# Patient Record
Sex: Male | Born: 1940 | Race: White | Hispanic: No | Marital: Married | Smoking: Never smoker
Health system: Southern US, Community
[De-identification: ages and names within clinical notes are randomized; demographics above are authoritative.]

## PROBLEM LIST (undated history)

## (undated) DIAGNOSIS — M48062 Spinal stenosis, lumbar region with neurogenic claudication: Secondary | ICD-10-CM

## (undated) DIAGNOSIS — R51 Headache: Secondary | ICD-10-CM

## (undated) DIAGNOSIS — T783XXA Angioneurotic edema, initial encounter: Secondary | ICD-10-CM

## (undated) DIAGNOSIS — R519 Headache, unspecified: Secondary | ICD-10-CM

## (undated) DIAGNOSIS — R972 Elevated prostate specific antigen [PSA]: Secondary | ICD-10-CM

## (undated) DIAGNOSIS — M1990 Unspecified osteoarthritis, unspecified site: Secondary | ICD-10-CM

## (undated) DIAGNOSIS — I10 Essential (primary) hypertension: Secondary | ICD-10-CM

## (undated) HISTORY — DX: Angioneurotic edema, initial encounter: T78.3XXA

## (undated) HISTORY — PX: COLONOSCOPY: SHX174

## (undated) HISTORY — PX: HEMORROIDECTOMY: SUR656

---

## 1996-03-14 HISTORY — PX: OTHER SURGICAL HISTORY: SHX169

## 2016-10-25 ENCOUNTER — Other Ambulatory Visit: Payer: Self-pay | Admitting: Neurosurgery

## 2016-10-25 DIAGNOSIS — M48062 Spinal stenosis, lumbar region with neurogenic claudication: Secondary | ICD-10-CM

## 2016-10-25 DIAGNOSIS — G9519 Other vascular myelopathies: Secondary | ICD-10-CM

## 2016-11-18 ENCOUNTER — Ambulatory Visit
Admission: RE | Admit: 2016-11-18 | Discharge: 2016-11-18 | Disposition: A | Discharge status: Home / Self Care | Payer: Medicare Other | Source: Ambulatory Visit | Attending: Neurosurgery | Admitting: Neurosurgery

## 2016-11-18 DIAGNOSIS — M48062 Spinal stenosis, lumbar region with neurogenic claudication: Secondary | ICD-10-CM

## 2016-11-18 DIAGNOSIS — G9519 Other vascular myelopathies: Secondary | ICD-10-CM

## 2016-11-23 ENCOUNTER — Other Ambulatory Visit: Payer: Self-pay | Admitting: Neurosurgery

## 2016-12-29 NOTE — Pre-Procedure Instructions (Signed)
Lauree ChandlerRickie Ferrante  12/29/2016      [facility] - [place], [place] - [id number] [place] [id number] Phone: [phone] Fax: [phone]    Your procedure is scheduled on Oct 22.  [facility] Admitting at 530 A.M.  Call this number if you have problems the morning of surgery:  [phone]   Remember:  Do not eat food or drink liquids after midnight.  Take these medicines the morning of surgery with A SIP OF WATER Tamsulosin (Flomax)  Stop taking aspirin, BC's, Goody's, Herbal medications, Ibuprofen, Advil, Motrin, Aleve   Do not wear jewelry, make-up or nail polish.  Do not wear lotions, powders, or perfumes, or deoderant.  Do not shave 48 hours prior to surgery.  Men may shave face and neck.  Do not bring valuables to the hospital.  [facility] is not responsible for any belongings or valuables.  Contacts, dentures or bridgework may not be worn into surgery.  Leave your suitcase in the car.  After surgery it may be brought to your room.  For patients admitted to the hospital, discharge time will be determined by your treatment team.  Patients discharged the day of surgery will not be allowed to drive home.    Special instructions:  [place] - Preparing for Surgery  Before surgery, you can play an important role.  Because skin is not sterile, your skin needs to be as free of germs as possible.  You can reduce the number of germs on you skin by washing with CHG (chlorahexidine gluconate) soap before surgery.  CHG is an antiseptic cleaner which kills germs and bonds with the skin to continue killing germs even after washing.  Please DO NOT use if you have an allergy to CHG or antibacterial soaps.  If your skin becomes reddened/irritated stop using the CHG and inform your nurse when you arrive at Short Stay.  Do not shave (including legs and underarms) for at least 48 hours prior to the first CHG  shower.  You may shave your face.  Please follow these instructions carefully:   1.  Shower with CHG Soap the night before surgery and the                                morning of Surgery.  2.  If you choose to wash your hair, wash your hair first as usual with your       normal shampoo.  3.  After you shampoo, rinse your hair and body thoroughly to remove the                      Shampoo.  4.  Use CHG as you would any other liquid soap.  You can apply chg directly       to the skin and wash gently with scrungie or a clean washcloth.  5.  Apply the CHG Soap to your body ONLY FROM THE NECK DOWN.        Do not use on open wounds or open sores.  Avoid contact with your eyes,       ears, mouth and genitals (private parts).  Wash genitals (private parts)       with your normal soap.  6.  Wash thoroughly, paying special attention to the area where your surgery  will be performed.  7.  Thoroughly rinse your body with warm water from the neck down.  8.  DO NOT shower/wash with your normal soap after using and rinsing off       the CHG Soap.  9.  Pat yourself dry with a clean towel.            10.  Wear clean pajamas.            11.  Place clean sheets on your bed the night of your first shower and do not        sleep with pets.  Day of Surgery  Do not apply any lotions/deoderants the morning of surgery.  Please wear clean clothes to the hospital/surgery center.     Please read over the following fact sheets that you were given. Pain Booklet, Coughing and Deep Breathing, MRSA Information and Surgical Site Infection Prevention

## 2016-12-30 ENCOUNTER — Encounter (HOSPITAL_COMMUNITY)
Admission: RE | Admit: 2016-12-30 | Discharge: 2016-12-30 | Disposition: A | Discharge status: Home / Self Care | Payer: Medicare Other | Source: Ambulatory Visit | Attending: Neurosurgery | Admitting: Neurosurgery

## 2016-12-30 ENCOUNTER — Other Ambulatory Visit: Payer: Self-pay

## 2016-12-30 ENCOUNTER — Encounter (HOSPITAL_COMMUNITY): Payer: Self-pay

## 2016-12-30 HISTORY — DX: Essential (primary) hypertension: I10

## 2016-12-30 HISTORY — DX: Headache, unspecified: R51.9

## 2016-12-30 HISTORY — DX: Elevated prostate specific antigen (PSA): R97.20

## 2016-12-30 HISTORY — DX: Headache: R51

## 2016-12-30 HISTORY — DX: Unspecified osteoarthritis, unspecified site: M19.90

## 2016-12-30 LAB — CBC
HCT: 43.2 % (ref 39.0–52.0)
HEMOGLOBIN: 14.5 g/dL (ref 13.0–17.0)
MCH: 29.8 pg (ref 26.0–34.0)
MCHC: 33.6 g/dL (ref 30.0–36.0)
MCV: 88.9 fL (ref 78.0–100.0)
Platelets: 222 10*3/uL (ref 150–400)
RBC: 4.86 MIL/uL (ref 4.22–5.81)
RDW: 12.7 % (ref 11.5–15.5)
WBC: 8.2 10*3/uL (ref 4.0–10.5)

## 2016-12-30 LAB — BASIC METABOLIC PANEL
ANION GAP: 8 (ref 5–15)
BUN: 20 mg/dL (ref 6–20)
CALCIUM: 9.4 mg/dL (ref 8.9–10.3)
CHLORIDE: 107 mmol/L (ref 101–111)
CO2: 24 mmol/L (ref 22–32)
Creatinine, Ser: 1.07 mg/dL (ref 0.61–1.24)
GFR calc non Af Amer: >60 mL/min (ref >60)
Glucose, Bld: 94 mg/dL (ref 65–99)
Potassium: 3.7 mmol/L (ref 3.5–5.1)
Sodium: 139 mmol/L (ref 135–145)

## 2016-12-30 LAB — TYPE AND SCREEN
ABO/RH(D): O POS
Antibody Screen: NEGATIVE

## 2016-12-30 LAB — SURGICAL PCR SCREEN
MRSA, PCR: NEGATIVE
Staphylococcus aureus: NEGATIVE

## 2016-12-30 LAB — ABO/RH: ABO/RH(D): O POS

## 2016-12-30 NOTE — Progress Notes (Signed)
EKG  to Chauncey MannAngela Kabbee, FNP to review.

## 2016-12-30 NOTE — Progress Notes (Addendum)
PC[place] [facility] [place] [facility] [place] Denies ever seeing a cardiologist.  Denies any chest pain, fever, or cough. Denies ever having a stress test, echo, or card cath. Denies having a recent EKG or CXR.

## 2016-12-30 NOTE — Progress Notes (Signed)
Anesthesia Chart Review:  Pt is a 76 year old male scheduled for L2-3 decompression, PLIF, and posterior lateral arthrodesis on 01/02/2017 with Shirlean Kellyobert Nudelman, MD  PMH includes:  HTN. Never smoker. BMI 29  Medications include: benazepril, pravastatin, dyazide.   BP (!) 150/96   Pulse 94   Temp 36.4 C   Resp 20   Ht 5\' 10"  (1.778 m)   Wt 200 lb 11.2 oz (91 kg)   SpO2 96%   BMI 28.80 kg/m   Preoperative labs reviewed.    EKG 12/30/16: NSR. LAFB.   If no changes, I anticipate pt can proceed with surgery as scheduled.    Rica Mastngela Tascha Casares, FNP-BC [facility]esthesiology Phone: [phone] 12/30/2016 4:53 PM

## 2017-01-01 NOTE — Anesthesia Preprocedure Evaluation (Addendum)
Anesthesia Evaluation  Patient identified by MRN, date of birth, ID band Patient awake    Reviewed: Allergy & Precautions, NPO status , Patient's Chart, lab work & pertinent test results  History of Anesthesia Complications Negative for: history of anesthetic complications  Airway Mallampati: II  TM Distance: >3 FB Neck ROM: Full    Dental no notable dental hx. (+) Dental Advisory Given   Pulmonary neg pulmonary ROS,    Pulmonary exam normal        Cardiovascular hypertension, Pt. on medications Normal cardiovascular exam     Neuro/Psych  Headaches,    GI/Hepatic negative GI ROS, Neg liver ROS,   Endo/Other  negative endocrine ROS  Renal/GU negative Renal ROS     Musculoskeletal negative musculoskeletal ROS (+)   Abdominal   Peds  Hematology negative hematology ROS (+)   Anesthesia Other Findings Day of surgery medications reviewed with the patient.  Reproductive/Obstetrics                            Anesthesia Physical Anesthesia Plan  ASA: II  Anesthesia Plan: General   Post-op Pain Management:    Induction: Intravenous  PONV Risk Score and Plan: 3 and Ondansetron, Dexamethasone and Scopolamine patch - Pre-op  Airway Management Planned: Oral ETT  Additional Equipment:   Intra-op Plan:   Post-operative Plan: Extubation in OR  Informed Consent: I have reviewed the patients History and Physical, chart, labs and discussed the procedure including the risks, benefits and alternatives for the proposed anesthesia with the patient or authorized representative who has indicated his/her understanding and acceptance.   Dental advisory given  Plan Discussed with: CRNA, Anesthesiologist and Surgeon  Anesthesia Plan Comments:        Anesthesia Quick Evaluation

## 2017-01-02 ENCOUNTER — Inpatient Hospital Stay (HOSPITAL_COMMUNITY)
Admission: RE | Disposition: A | Discharge status: Home / Self Care | Payer: Self-pay | Source: Ambulatory Visit | Attending: Neurosurgery

## 2017-01-02 ENCOUNTER — Inpatient Hospital Stay (HOSPITAL_COMMUNITY): Payer: Medicare Other | Admitting: Emergency Medicine

## 2017-01-02 ENCOUNTER — Encounter (HOSPITAL_COMMUNITY): Payer: Self-pay

## 2017-01-02 ENCOUNTER — Inpatient Hospital Stay (HOSPITAL_COMMUNITY): Payer: Medicare Other

## 2017-01-02 ENCOUNTER — Inpatient Hospital Stay (HOSPITAL_COMMUNITY): Payer: Medicare Other | Admitting: Anesthesiology

## 2017-01-02 ENCOUNTER — Inpatient Hospital Stay (HOSPITAL_COMMUNITY)
Admission: RE | Admit: 2017-01-02 | Discharge: 2017-01-03 | DRG: 455 | Disposition: A | Discharge status: Home / Self Care | Payer: Medicare Other | Source: Ambulatory Visit | Attending: Neurosurgery | Admitting: Neurosurgery

## 2017-01-02 DIAGNOSIS — I10 Essential (primary) hypertension: Secondary | ICD-10-CM | POA: Diagnosis present

## 2017-01-02 DIAGNOSIS — M5136 Other intervertebral disc degeneration, lumbar region: Secondary | ICD-10-CM | POA: Diagnosis present

## 2017-01-02 DIAGNOSIS — M5126 Other intervertebral disc displacement, lumbar region: Secondary | ICD-10-CM | POA: Diagnosis present

## 2017-01-02 DIAGNOSIS — Z79899 Other long term (current) drug therapy: Secondary | ICD-10-CM | POA: Diagnosis not present

## 2017-01-02 DIAGNOSIS — M545 Low back pain: Secondary | ICD-10-CM | POA: Diagnosis present

## 2017-01-02 DIAGNOSIS — M48062 Spinal stenosis, lumbar region with neurogenic claudication: Principal | ICD-10-CM | POA: Diagnosis present

## 2017-01-02 DIAGNOSIS — M479 Spondylosis, unspecified: Secondary | ICD-10-CM | POA: Diagnosis present

## 2017-01-02 DIAGNOSIS — Z419 Encounter for procedure for purposes other than remedying health state, unspecified: Secondary | ICD-10-CM

## 2017-01-02 HISTORY — DX: Spinal stenosis, lumbar region with neurogenic claudication: M48.062

## 2017-01-02 SURGERY — POSTERIOR LUMBAR FUSION 1 LEVEL
Anesthesia: General | Site: Back

## 2017-01-02 MED ORDER — SODIUM CHLORIDE 0.9% FLUSH
3.0000 mL | Freq: Two times a day (BID) | INTRAVENOUS | Status: DC
  Administered 2017-01-02: 3 mL via INTRAVENOUS

## 2017-01-02 MED ORDER — PRAVASTATIN SODIUM 10 MG PO TABS
10.0000 mg | ORAL_TABLET | Freq: Every day | ORAL | Status: DC
  Administered 2017-01-02: 10 mg via ORAL
  Filled 2017-01-02: qty 1

## 2017-01-02 MED ORDER — SODIUM CHLORIDE 0.9 % IV SOLN: 250.0000 mL | INTRAVENOUS | Status: DC

## 2017-01-02 MED ORDER — TRIAMTERENE-HCTZ 37.5-25 MG PO TABS
1.0000 | ORAL_TABLET | Freq: Every day | ORAL | Status: DC
  Administered 2017-01-02: 1 via ORAL
  Filled 2017-01-02 (×2): qty 1

## 2017-01-02 MED ORDER — BUPIVACAINE HCL (PF) 0.5 % IJ SOLN
INTRAMUSCULAR | Status: DC | PRN
  Administered 2017-01-02: 5 mL

## 2017-01-02 MED ORDER — SUGAMMADEX SODIUM 200 MG/2ML IV SOLN
INTRAVENOUS | Status: DC | PRN
  Administered 2017-01-02: 181.4 mg via INTRAVENOUS

## 2017-01-02 MED ORDER — SUCCINYLCHOLINE CHLORIDE 200 MG/10ML IV SOSY
PREFILLED_SYRINGE | INTRAVENOUS | Status: AC
  Filled 2017-01-02: qty 10

## 2017-01-02 MED ORDER — PHENOL 1.4 % MT LIQD: 1.0000 | OROMUCOSAL | Status: DC | PRN

## 2017-01-02 MED ORDER — LIDOCAINE-EPINEPHRINE 1 %-1:100000 IJ SOLN
INTRAMUSCULAR | Status: AC
  Filled 2017-01-02: qty 1

## 2017-01-02 MED ORDER — HYDROXYZINE HCL 50 MG/ML IM SOLN: 50.0000 mg | INTRAMUSCULAR | Status: DC | PRN

## 2017-01-02 MED ORDER — ONDANSETRON HCL 4 MG/2ML IJ SOLN
INTRAMUSCULAR | Status: DC | PRN
  Administered 2017-01-02: 4 mg via INTRAVENOUS

## 2017-01-02 MED ORDER — VITAMIN D 1000 UNITS PO TABS: 1000.0000 [IU] | ORAL_TABLET | Freq: Every day | ORAL | Status: DC

## 2017-01-02 MED ORDER — ACETAMINOPHEN 650 MG RE SUPP: 650.0000 mg | RECTAL | Status: DC | PRN

## 2017-01-02 MED ORDER — ONDANSETRON HCL 4 MG/2ML IJ SOLN
INTRAMUSCULAR | Status: AC
  Filled 2017-01-02: qty 2

## 2017-01-02 MED ORDER — BENAZEPRIL HCL 5 MG PO TABS
5.0000 mg | ORAL_TABLET | Freq: Every day | ORAL | Status: DC
  Administered 2017-01-02: 5 mg via ORAL
  Filled 2017-01-02 (×2): qty 1

## 2017-01-02 MED ORDER — DEXAMETHASONE SODIUM PHOSPHATE 10 MG/ML IJ SOLN
INTRAMUSCULAR | Status: DC | PRN
  Administered 2017-01-02: 10 mg via INTRAVENOUS

## 2017-01-02 MED ORDER — CHLORHEXIDINE GLUCONATE CLOTH 2 % EX PADS: 6.0000 | MEDICATED_PAD | Freq: Once | CUTANEOUS | Status: DC

## 2017-01-02 MED ORDER — BISACODYL 10 MG RE SUPP: 10.0000 mg | Freq: Every day | RECTAL | Status: DC | PRN

## 2017-01-02 MED ORDER — PHENYLEPHRINE 40 MCG/ML (10ML) SYRINGE FOR IV PUSH (FOR BLOOD PRESSURE SUPPORT)
PREFILLED_SYRINGE | INTRAVENOUS | Status: AC
  Filled 2017-01-02: qty 10

## 2017-01-02 MED ORDER — EPHEDRINE SULFATE 50 MG/ML IJ SOLN
INTRAMUSCULAR | Status: DC | PRN
  Administered 2017-01-02 (×2): 10 mg via INTRAVENOUS

## 2017-01-02 MED ORDER — FLEET ENEMA 7-19 GM/118ML RE ENEM: 1.0000 | ENEMA | Freq: Once | RECTAL | Status: DC | PRN

## 2017-01-02 MED ORDER — BACITRACIN 50000 UNITS IM SOLR
INTRAMUSCULAR | Status: DC | PRN
  Administered 2017-01-02: 08:00:00

## 2017-01-02 MED ORDER — ROCURONIUM BROMIDE 100 MG/10ML IV SOLN
INTRAVENOUS | Status: DC | PRN
  Administered 2017-01-02: 30 mg via INTRAVENOUS
  Administered 2017-01-02: 50 mg via INTRAVENOUS
  Administered 2017-01-02: 20 mg via INTRAVENOUS

## 2017-01-02 MED ORDER — HYDROCODONE-ACETAMINOPHEN 5-325 MG PO TABS
1.0000 | ORAL_TABLET | ORAL | Status: DC | PRN
  Administered 2017-01-02: 2 via ORAL
  Filled 2017-01-02: qty 2

## 2017-01-02 MED ORDER — ACETAMINOPHEN 10 MG/ML IV SOLN
INTRAVENOUS | Status: DC | PRN
  Administered 2017-01-02: 1000 mg via INTRAVENOUS

## 2017-01-02 MED ORDER — MENTHOL 3 MG MT LOZG
1.0000 | LOZENGE | OROMUCOSAL | Status: DC | PRN
Start: 2017-01-02 — End: 2017-01-03

## 2017-01-02 MED ORDER — ONDANSETRON HCL 4 MG/2ML IJ SOLN: 4.0000 mg | Freq: Four times a day (QID) | INTRAMUSCULAR | Status: DC | PRN

## 2017-01-02 MED ORDER — BUPIVACAINE HCL (PF) 0.5 % IJ SOLN
INTRAMUSCULAR | Status: AC
  Filled 2017-01-02: qty 30

## 2017-01-02 MED ORDER — ROCURONIUM BROMIDE 10 MG/ML (PF) SYRINGE
PREFILLED_SYRINGE | INTRAVENOUS | Status: AC
  Filled 2017-01-02: qty 5

## 2017-01-02 MED ORDER — CYCLOBENZAPRINE HCL 5 MG PO TABS: 5.0000 mg | ORAL_TABLET | Freq: Three times a day (TID) | ORAL | Status: DC | PRN

## 2017-01-02 MED ORDER — ACETAMINOPHEN 325 MG PO TABS: 650.0000 mg | ORAL_TABLET | ORAL | Status: DC | PRN

## 2017-01-02 MED ORDER — THROMBIN 20000 UNITS EX KIT
PACK | CUTANEOUS | Status: AC
  Filled 2017-01-02: qty 1

## 2017-01-02 MED ORDER — KETOROLAC TROMETHAMINE 15 MG/ML IJ SOLN
INTRAMUSCULAR | Status: AC
  Filled 2017-01-02: qty 2

## 2017-01-02 MED ORDER — THROMBIN (RECOMBINANT) 5000 UNITS EX SOLR
OROMUCOSAL | Status: DC | PRN
  Administered 2017-01-02: 09:00:00 via TOPICAL

## 2017-01-02 MED ORDER — ONDANSETRON HCL 4 MG PO TABS: 4.0000 mg | ORAL_TABLET | Freq: Four times a day (QID) | ORAL | Status: DC | PRN

## 2017-01-02 MED ORDER — MAGNESIUM HYDROXIDE 400 MG/5ML PO SUSP: 30.0000 mL | Freq: Every day | ORAL | Status: DC | PRN

## 2017-01-02 MED ORDER — EPHEDRINE 5 MG/ML INJ
INTRAVENOUS | Status: AC
  Filled 2017-01-02: qty 10

## 2017-01-02 MED ORDER — THROMBIN (RECOMBINANT) 5000 UNITS EX SOLR
CUTANEOUS | Status: AC
  Filled 2017-01-02: qty 5000

## 2017-01-02 MED ORDER — HYDROMORPHONE HCL 1 MG/ML IJ SOLN: 0.2500 mg | INTRAMUSCULAR | Status: DC | PRN

## 2017-01-02 MED ORDER — MORPHINE SULFATE (PF) 4 MG/ML IV SOLN: 4.0000 mg | INTRAVENOUS | Status: DC | PRN

## 2017-01-02 MED ORDER — FENTANYL CITRATE (PF) 100 MCG/2ML IJ SOLN
INTRAMUSCULAR | Status: DC | PRN
  Administered 2017-01-02 (×5): 50 ug via INTRAVENOUS
  Administered 2017-01-02: 100 ug via INTRAVENOUS

## 2017-01-02 MED ORDER — KETOROLAC TROMETHAMINE 30 MG/ML IJ SOLN
15.0000 mg | Freq: Four times a day (QID) | INTRAMUSCULAR | Status: DC
  Administered 2017-01-02 – 2017-01-03 (×3): 15 mg via INTRAVENOUS
  Filled 2017-01-02 (×3): qty 1

## 2017-01-02 MED ORDER — SURGIFOAM 100 EX MISC
CUTANEOUS | Status: DC | PRN
  Administered 2017-01-02: 08:00:00 via TOPICAL

## 2017-01-02 MED ORDER — PROMETHAZINE HCL 25 MG/ML IJ SOLN: 6.2500 mg | INTRAMUSCULAR | Status: DC | PRN

## 2017-01-02 MED ORDER — KETOROLAC TROMETHAMINE 30 MG/ML IJ SOLN
15.0000 mg | Freq: Once | INTRAMUSCULAR | Status: AC
  Administered 2017-01-02: 15 mg via INTRAVENOUS

## 2017-01-02 MED ORDER — KCL IN DEXTROSE-NACL 30-5-0.45 MEQ/L-%-% IV SOLN
INTRAVENOUS | Status: DC
  Filled 2017-01-02: qty 1000

## 2017-01-02 MED ORDER — ALUM & MAG HYDROXIDE-SIMETH 200-200-20 MG/5ML PO SUSP: 30.0000 mL | Freq: Four times a day (QID) | ORAL | Status: DC | PRN

## 2017-01-02 MED ORDER — CEFAZOLIN SODIUM-DEXTROSE 2-4 GM/100ML-% IV SOLN
2.0000 g | INTRAVENOUS | Status: AC
  Administered 2017-01-02: 2 g via INTRAVENOUS

## 2017-01-02 MED ORDER — PROPOFOL 10 MG/ML IV BOLUS
INTRAVENOUS | Status: DC | PRN
  Administered 2017-01-02: 150 mg via INTRAVENOUS

## 2017-01-02 MED ORDER — SUCCINYLCHOLINE CHLORIDE 20 MG/ML IJ SOLN
INTRAMUSCULAR | Status: DC | PRN
  Administered 2017-01-02: 120 mg via INTRAVENOUS

## 2017-01-02 MED ORDER — KETOROLAC TROMETHAMINE 30 MG/ML IJ SOLN
INTRAMUSCULAR | Status: AC
  Filled 2017-01-02: qty 1

## 2017-01-02 MED ORDER — CEFAZOLIN SODIUM-DEXTROSE 2-4 GM/100ML-% IV SOLN
INTRAVENOUS | Status: AC
  Filled 2017-01-02: qty 100

## 2017-01-02 MED ORDER — FENTANYL CITRATE (PF) 250 MCG/5ML IJ SOLN
INTRAMUSCULAR | Status: AC
  Filled 2017-01-02: qty 5

## 2017-01-02 MED ORDER — LIDOCAINE-EPINEPHRINE 1 %-1:100000 IJ SOLN
INTRAMUSCULAR | Status: DC | PRN
  Administered 2017-01-02: 5 mL via INTRADERMAL

## 2017-01-02 MED ORDER — SODIUM CHLORIDE 0.9% FLUSH: 3.0000 mL | INTRAVENOUS | Status: DC | PRN

## 2017-01-02 MED ORDER — LACTATED RINGERS IV SOLN
INTRAVENOUS | Status: DC | PRN
  Administered 2017-01-02 (×2): via INTRAVENOUS

## 2017-01-02 MED ORDER — 0.9 % SODIUM CHLORIDE (POUR BTL) OPTIME
TOPICAL | Status: DC | PRN
  Administered 2017-01-02 (×2): 1000 mL

## 2017-01-02 MED ORDER — PHENYLEPHRINE HCL 10 MG/ML IJ SOLN
INTRAMUSCULAR | Status: DC | PRN
  Administered 2017-01-02: 25 ug/min via INTRAVENOUS

## 2017-01-02 MED ORDER — ALBUMIN HUMAN 5 % IV SOLN
INTRAVENOUS | Status: DC | PRN
  Administered 2017-01-02: 10:00:00 via INTRAVENOUS

## 2017-01-02 MED ORDER — LIDOCAINE HCL (CARDIAC) 20 MG/ML IV SOLN
INTRAVENOUS | Status: DC | PRN
  Administered 2017-01-02: 100 mg via INTRAVENOUS

## 2017-01-02 MED ORDER — PROPOFOL 10 MG/ML IV BOLUS
INTRAVENOUS | Status: AC
  Filled 2017-01-02: qty 20

## 2017-01-02 MED ORDER — DEXAMETHASONE SODIUM PHOSPHATE 10 MG/ML IJ SOLN
INTRAMUSCULAR | Status: AC
  Filled 2017-01-02: qty 1

## 2017-01-02 MED ORDER — ACETAMINOPHEN 10 MG/ML IV SOLN
INTRAVENOUS | Status: AC
  Filled 2017-01-02: qty 100

## 2017-01-02 MED ORDER — HYDROXYZINE HCL 25 MG PO TABS: 50.0000 mg | ORAL_TABLET | ORAL | Status: DC | PRN

## 2017-01-02 MED ORDER — TAMSULOSIN HCL 0.4 MG PO CAPS: 0.4000 mg | ORAL_CAPSULE | Freq: Every day | ORAL | Status: DC

## 2017-01-02 SURGICAL SUPPLY — 69 items
BAG DECANTER FOR FLEXI CONT (MISCELLANEOUS) ×2 IMPLANT
BLADE CLIPPER SURG (BLADE) IMPLANT
BUR ACRON 5.0MM COATED (BURR) ×2 IMPLANT
BUR MATCHSTICK NEURO 3.0 LAGG (BURR) ×2 IMPLANT
CANISTER SUCT 3000ML PPV (MISCELLANEOUS) ×2 IMPLANT
CAP LCK SPNE (Orthopedic Implant) ×4 IMPLANT
CAP LOCK SPINE RADIUS (Orthopedic Implant) ×4 IMPLANT
CAP LOCKING (Orthopedic Implant) ×4 IMPLANT
CARTRIDGE OIL MAESTRO DRILL (MISCELLANEOUS) ×1 IMPLANT
CONT SPEC 4OZ CLIKSEAL STRL BL (MISCELLANEOUS) ×2 IMPLANT
COVER BACK TABLE 60X90IN (DRAPES) ×2 IMPLANT
DERMABOND ADVANCED (GAUZE/BANDAGES/DRESSINGS) ×2
DERMABOND ADVANCED .7 DNX12 (GAUZE/BANDAGES/DRESSINGS) ×2 IMPLANT
DIFFUSER DRILL AIR PNEUMATIC (MISCELLANEOUS) ×2 IMPLANT
DRAPE C-ARM 42X72 X-RAY (DRAPES) ×4 IMPLANT
DRAPE C-ARMOR (DRAPES) ×2 IMPLANT
DRAPE HALF SHEET 40X57 (DRAPES) IMPLANT
DRAPE LAPAROTOMY 100X72X124 (DRAPES) ×2 IMPLANT
DRAPE POUCH INSTRU U-SHP 10X18 (DRAPES) ×2 IMPLANT
ELECT REM PT RETURN 9FT ADLT (ELECTROSURGICAL) ×2
ELECTRODE REM PT RTRN 9FT ADLT (ELECTROSURGICAL) ×1 IMPLANT
GAUZE SPONGE 4X4 12PLY STRL (GAUZE/BANDAGES/DRESSINGS) ×2 IMPLANT
GAUZE SPONGE 4X4 16PLY XRAY LF (GAUZE/BANDAGES/DRESSINGS) ×2 IMPLANT
GLOVE BIOGEL PI IND STRL 8 (GLOVE) ×4 IMPLANT
GLOVE BIOGEL PI INDICATOR 8 (GLOVE) ×4
GLOVE ECLIPSE 7.5 STRL STRAW (GLOVE) ×8 IMPLANT
GLOVE ECLIPSE 9.0 STRL (GLOVE) ×2 IMPLANT
GOWN STRL REUS W/ TWL LRG LVL3 (GOWN DISPOSABLE) ×1 IMPLANT
GOWN STRL REUS W/ TWL XL LVL3 (GOWN DISPOSABLE) ×4 IMPLANT
GOWN STRL REUS W/TWL 2XL LVL3 (GOWN DISPOSABLE) IMPLANT
GOWN STRL REUS W/TWL LRG LVL3 (GOWN DISPOSABLE) ×1
GOWN STRL REUS W/TWL XL LVL3 (GOWN DISPOSABLE) ×4
HEMOSTAT POWDER KIT SURGIFOAM (HEMOSTASIS) ×2 IMPLANT
KIT BASIN OR (CUSTOM PROCEDURE TRAY) ×2 IMPLANT
KIT INFUSE X SMALL 1.4CC (Orthopedic Implant) ×2 IMPLANT
KIT ROOM TURNOVER OR (KITS) ×2 IMPLANT
MILL MEDIUM DISP (BLADE) ×2 IMPLANT
NEEDLE ASP BONE MRW 8GX15 (NEEDLE) ×2 IMPLANT
NEEDLE SPNL 18GX3.5 QUINCKE PK (NEEDLE) ×2 IMPLANT
NEEDLE SPNL 22GX3.5 QUINCKE BK (NEEDLE) ×2 IMPLANT
NS IRRIG 1000ML POUR BTL (IV SOLUTION) ×4 IMPLANT
OIL CARTRIDGE MAESTRO DRILL (MISCELLANEOUS) ×2
PACK LAMINECTOMY NEURO (CUSTOM PROCEDURE TRAY) ×2 IMPLANT
PAD ARMBOARD 7.5X6 YLW CONV (MISCELLANEOUS) ×6 IMPLANT
PATTIES SURGICAL .5 X.5 (GAUZE/BANDAGES/DRESSINGS) IMPLANT
PATTIES SURGICAL .5 X1 (DISPOSABLE) ×2 IMPLANT
PATTIES SURGICAL 1X1 (DISPOSABLE) ×2 IMPLANT
ROD RADIUS 40MM (Neuro Prosthesis/Implant) ×1 IMPLANT
ROD RADIUS 45MM (Rod) ×1 IMPLANT
ROD SPNL 40X5.5XNS TI RDS (Neuro Prosthesis/Implant) ×1 IMPLANT
ROD SPNL 45X5.5XNS TI RDS (Rod) ×1 IMPLANT
SCREW 5.75X45MM (Screw) ×6 IMPLANT
SCREW 5.75X50MM (Screw) ×2 IMPLANT
SPACER SPINAL 9X20X10MM 4D (Spacer) ×6 IMPLANT
SPONGE LAP 4X18 X RAY DECT (DISPOSABLE) IMPLANT
SPONGE NEURO XRAY DETECT 1X3 (DISPOSABLE) IMPLANT
SPONGE SURGIFOAM ABS GEL 100 (HEMOSTASIS) ×2 IMPLANT
STRIP BIOACTIVE VITOSS 25X100X (Neuro Prosthesis/Implant) ×2 IMPLANT
STRIP BIOACTIVE VITOSS 25X52X4 (Orthopedic Implant) ×2 IMPLANT
SUT VIC AB 1 CT1 18XBRD ANBCTR (SUTURE) ×3 IMPLANT
SUT VIC AB 1 CT1 8-18 (SUTURE) ×3
SUT VIC AB 2-0 CP2 18 (SUTURE) ×4 IMPLANT
SYR 3ML LL SCALE MARK (SYRINGE) ×4 IMPLANT
SYR CONTROL 10ML LL (SYRINGE) ×4 IMPLANT
TAPE CLOTH SURG 4X10 WHT LF (GAUZE/BANDAGES/DRESSINGS) ×2 IMPLANT
TOWEL GREEN STERILE (TOWEL DISPOSABLE) ×2 IMPLANT
TOWEL GREEN STERILE FF (TOWEL DISPOSABLE) ×2 IMPLANT
TRAY FOLEY W/METER SILVER 16FR (SET/KITS/TRAYS/PACK) ×2 IMPLANT
WATER STERILE IRR 1000ML POUR (IV SOLUTION) ×2 IMPLANT

## 2017-01-02 NOTE — Anesthesia Procedure Notes (Signed)
Procedure Name: Intubation Date/Time: 01/02/2017 7:47 AM Performed by: Lavell Luster Pre-anesthesia Checklist: Patient identified, Emergency Drugs available, Suction available, Patient being monitored and Timeout performed Patient Re-evaluated:Patient Re-evaluated prior to induction Oxygen Delivery Method: Circle system utilized Preoxygenation: Pre-oxygenation with 100% oxygen Induction Type: IV induction Ventilation: Mask ventilation without difficulty Laryngoscope Size: Mac and 4 Grade View: Grade I Tube type: Oral Tube size: 7.5 mm Number of attempts: 1 Airway Equipment and Method: Stylet Placement Confirmation: ETT inserted through vocal cords under direct vision,  positive ETCO2 and breath sounds checked- equal and bilateral Secured at: 21 cm Tube secured with: Tape Dental Injury: Teeth and Oropharynx as per pre-operative assessment

## 2017-01-02 NOTE — H&P (Signed)
Subjective: Patient is a 76 y.o. 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 Patient has had difficulties with neurogenic claudication for 6 months. He's been treated with prednisone, physical therapy, and NSAIDs, but continues to have pain to the lower extremity bilaterally, brought on by standing and walking. He is admitted now for a lumbar decompression and stabilization including bilateral L2 and L3 decompressive lumbar laminectomy with bilateral facetectomy and foraminotomies with resection of the large disc examination, and stabilization via an L2-3 posterior lumbar interbody arthrodesis with peek interbody implants and bone graft and a bilateral L2-3 posterior lateral arthrodesis with posterior instrumentation and bone graft.    Past Medical History:  Diagnosis Date  . Arthritis    spine  . Headache    at times  . High prostate specific antigen (PSA)   . Hypertension   . Lumbar stenosis with neurogenic claudication     Past Surgical History:  Procedure Laterality Date  . COLONOSCOPY    . fatty tissue   1998   removed from from back   . HEMORROIDECTOMY      Prescriptions Prior to Admission  Medication Sig Dispense Refill Last Dose  . benazepril (LOTENSIN) 10 MG tablet benazepril 5 mg tablet  Take 1 tablet every day by oral route.   01/01/2017 at Unknown time  . Cholecalciferol (D3-1000) 1000 units capsule Take 1,000 Units by mouth daily.   Past Week at Unknown time  . pravastatin (PRAVACHOL) 20 MG tablet Take 10 mg by mouth daily.   01/01/2017 at Unknown time  . tamsulosin (FLOMAX) 0.4 MG CAPS capsule Take 0.4 mg by mouth daily.   01/02/2017 at 0400  . triamterene-hydrochlorothiazide (DYAZIDE) 37.5-25 MG capsule Take 1 capsule by mouth daily.    01/01/2017 at Unknown time   Allergies  Allergen Reactions  . Shellfish Allergy Nausea And Vomiting    Shrimp    Social History   Substance Use Topics  . Smoking status: Never Smoker  . Smokeless tobacco: Never Used  . Alcohol use No    Family History  Problem Relation Age of Onset  . Dementia Mother   . Heart attack Father   . Melanoma Sister   . Melanoma Brother      Review of Systems A comprehensive review of systems was negative.  Objective: Vital signs in last 24 hours: Temp:  [97.6 F (36.4 C)] 97.6 F (36.4 C) (10/22 11910632) Pulse Rate:  [76] 76 (10/22 0632) Resp:  [19] 19 (10/22 0632) BP: (140)/(96) 140/96 (10/22 0632) SpO2:  [96 %] 96 % (10/22 47820632) Weight:  [90.7 kg (200 lb)] 90.7 kg (200 lb) (10/22 0649)  EXAM: Patient is well-developed well-nourished white male in no acute distress.  Lungs are clear to auscultation , the patient has symmetrical respiratory excursion. Heart has a regular rate and rhythm normal S1 and S2 no murmur.   Abdomen is soft nontender nondistended bowel sounds are present. Extremity examination shows no clubbing cyanosis or edema. Motor examination shows 5 over 5 strength in the lower extremities including the iliopsoas quadriceps dorsiflexor extensor hallicus  longus and plantar flexor bilaterally. Sensation is intact to pinprick in the distal lower extremities. Reflexes are symmetrical bilaterally. No pathologic reflexes are present. Patient has a normal gait and stance.    Data Review:CBC    Component Value Date/Time   WBC 8.2 12/30/2016 1544   RBC 4.86 12/30/2016 1544   HGB 14.5 12/30/2016 1544  HCT 43.2 12/30/2016 1544   PLT 222 12/30/2016 1544   MCV 88.9 12/30/2016 1544   MCH 29.8 12/30/2016 1544   MCHC 33.6 12/30/2016 1544   RDW 12.7 12/30/2016 1544                          BMET    Component Value Date/Time   NA 139 12/30/2016 1544   K 3.7 12/30/2016 1544   CL 107 12/30/2016 1544   CO2 24 12/30/2016 1544   GLUCOSE 94 12/30/2016 1544   BUN 20 12/30/2016 1544   CREATININE 1.07 12/30/2016 1544   CALCIUM 9.4 12/30/2016 1544   GFRNONAA >60 12/30/2016  1544   GFRAA >60 12/30/2016 1544     Assessment/Plan: Patient with neurogenic claudication secondary to a massive L2-3 lumbar disc herniation, the wrist extended caudally behind the body of L3 with resulting severe stenosis and thecal sac compression, with associated instability. He is admitted now for an L2-3 lumbar decompression and stabilization.  I've discussed with the patient the nature of his condition, the nature the surgical procedure, the typical length of surgery, hospital stay, and overall recuperation, the limitations postoperatively, and risks of surgery. I discussed risks including risks of infection, bleeding, possibly need for transfusion, the risk of nerve root dysfunction with pain, weakness, numbness, or paresthesias, the risk of dural tear and CSF leakage and possible need for further surgery, the risk of failure of the arthrodesis and possibly for further surgery, the risk of anesthetic complications including myocardial infarction, stroke, pneumonia, and death. We discussed the need for postoperative immobilization in a lumbar brace. Understanding all this the patient does wish to proceed with surgery and is admitted for such.     Hewitt Shorts, MD 01/02/2017 7:10 AM

## 2017-01-02 NOTE — Anesthesia Postprocedure Evaluation (Signed)
Anesthesia Post Note  Patient: David Carpenter  Procedure(s) Performed: LUMBAR TWO- LUMBAR THREE DECOMPRESSION,POSTERIOR LUMBAR INTERBODY FUSION, AND POSTERIOR LATERAL ARTHRODESIS (N/A Back)     Patient location during evaluation: PACU Anesthesia Type: General Level of consciousness: sedated Pain management: pain level controlled Vital Signs Assessment: post-procedure vital signs reviewed and stable Respiratory status: spontaneous breathing and respiratory function stable Cardiovascular status: stable Postop Assessment: no apparent nausea or vomiting Anesthetic complications: no    Last Vitals:  Vitals:   01/02/17 1230 01/02/17 1245  BP:  (!) 110/97  Pulse:  100  Resp:  18  Temp: 36.5 C 36.5 C  SpO2:  97%    Last Pain:  Vitals:   01/02/17 1230  TempSrc:   PainSc: 0-No pain                 [name] DANIEL

## 2017-01-02 NOTE — Progress Notes (Signed)
Vitals:   01/02/17 1215 01/02/17 1230 01/02/17 1245 01/02/17 1557  BP: 129/86  (!) 110/97 119/83  Pulse: 91  100 88  Resp: 15  18 18   Temp:  97.7 F (36.5 C) 97.7 F (36.5 C) (!) 97 F (36.1 C)  TempSrc:      SpO2: 96%  97% 95%  Weight:      Height:        Patient doing well following lumbar decompression and arthrodesis today. Good relief of neurogenic claudication. Mild incisional discomfort. Dressing clean and dry. Foley DC'd and patient voiding well.  Plan: Doing well following surgery. Encouraged to ambulate.  Hewitt ShortsNUDELMAN,ROBERT W, MD 01/02/2017, 7:04 PM

## 2017-01-02 NOTE — Op Note (Signed)
01/02/2017  11:24 AM  PATIENT:  David Carpenter  76 y.o. male  PRE-OPERATIVE DIAGNOSIS:  Lumbar stenosis with neurogenic claudication, L2-3 lumbar disc herniation, lumbar instability, lumbar spondylosis, lumbar degenerative disc disease  POST-OPERATIVE DIAGNOSIS:  Lumbar stenosis with neurogenic claudication, L2-3 lumbar disc herniation, lumbar instability, lumbar spondylosis, lumbar degenerative disc disease  PROCEDURE:  Procedure(s):  Lumbar decompression including bilateral L2 and L3 decompressive lumbar laminectomy with bilateral facetectomy and foraminotomies for decompression of severe canal and neural foraminal stenosis with decompression of the exiting L2 and L3 nerve roots bilaterally, with decompression beyond that required for interbody arthrodesis; bilateral L2-3 microdiscectomy; bilateral L2-3 posterior lumbar interbody arthrodesis with AVS peek interbody implants, Vitoss BA with bone marrow aspirate, and infuse; bilateral L2-3 posterior lateral arthrodesis with nonsegmental radius posterior instrumentation, locally harvested morcellized autograft, Vitoss BA with bone marrow aspirate, and infuse  SURGEON:  Shirlean Kelly, M.D.  ASSISTANTS: Julio Sicks, M.D.  ANESTHESIA:   spinal and general  EBL:  Total I/O In: 2100 [I.V.:1700; Blood:150; IV Piggyback:250] Out: 785 [Urine:385; Blood:400]  BLOOD ADMINISTERED:150 CC CELLSAVER  COUNT: Correct per nursing staff  DICTATION: Patient is brought to the operating room placed under general endotracheal anesthesia. The patient was turned to prone position the lumbar region was prepped with Betadine soap and solution and draped in a sterile fashion. The midline was infiltrated with local anesthesia with epinephrine. A localizing x-ray was taken and then a midline incision was made carried down through the subcutaneous tissue, bipolar cautery and electrocautery were used to maintain hemostasis. Dissection was carried down to the lumbar  fascia. The fascia was incised bilaterally and the paraspinal muscles were dissected with a spinous process and lamina in a subperiosteal fashion. Another x-ray was taken for localization and the L2-3 level was localized. Dissection was then carried out laterally over the facet complexes and the transverse processes of L2 and L3 were exposed and decorticated bilaterally.  We then proceeded with the decompression. Bilateral L2 and L3 decompressive lumbar laminectomy was performed using double-action rongeurs, the high-speed drill and Kerrison punches. Dissection was carried out laterally including facetectomy and foraminotomies with decompression of the stenotic compression of the exiting L2 and L3 nerve roots. We then proceeded with the bilateral L2-3 microdiscectomy. The epidural space was explored, and the large disc examination to have at least extended caudally behind the body of L3 was identified. The herniated fragments were removed in a piecemeal fashion with progressive decompression of the thecal sac. We did incise the annulus, and a thorough intradiscal discectomy was performed using micro-curettes and pituitary rongeurs. Once the decompression stenotic compression of the thecal sac and exiting nerve roots was completed we proceeded with the posterior lumbar interbody arthrodesis. Once the discectomy was completed we began to prepare the endplate surfaces removing the cartilaginous endplates surface. We then measured the height of the intervertebral disc space. We selected 10 x 20 x 4 AVS peek interbody implants.  The C-arm fluoroscope was then draped and brought in the field and we identified the pedicle entry points bilaterally at the L2 and L3 levels. Each of the 4 pedicles was probed, we aspirated bone marrow aspirate from the vertebral bodies, this was injected over a 10 cc and a 5 cc strip of Vitoss BA. Then each of the pedicles was examined with the ball probe good bony surfaces were found and  no bony cuts were found. Each of the pedicles was then tapped with a 5.25 mm tap, again examined with the ball  probe good threading was found and no bony cuts were found. We then placed 5.75 by 45 mm screws bilaterally at the L2 level and the right side at L3, and a 5.75 by 50 mm screw was placed in the left side at L3.  We then packed the AVS peek interbody implants with Vitoss BA with bone marrow aspirate and infuse, and then placed the first implant and on the right side, carefully retracting the thecal sac and nerve root medially. We then went back to the left side and packed the midline with additional Vitoss BA with bone marrow aspirate, and then placed a second implant and on the left side again retracting the thecal sac and nerve root medially. Additional Vitoss BA with bone marrow aspirate was packed lateral to the implants.  We then packed the lateral gutter over the transverse processes and intertransverse space with locally harvested morcellized autograft, Vitoss BA with bone marrow aspirate, and infuse. We then selected pre-lordosed rods. We used a 45 mm rod on the left side and a 40 mm rod on the right side.  They were placed within the screw heads and secured with locking caps once all 4 locking caps were placed final tightening was performed against a counter torque.  The wound had been irrigated multiple times during the procedure with saline solution and bacitracin solution, good hemostasis was established with a combination of bipolar cautery and Gelfoam with thrombin. The Gelfoam was removed, and a thin layer of Surgifoam applied. Once good hemostasis was confirmed we proceeded with closure paraspinal muscles deep fascia and Scarpa's fascia were closed with interrupted undyed 1 Vicryl sutures the subcutaneous and subcuticular closed with interrupted inverted 2-0 undyed Vicryl sutures the skin edges were approximated with Dermabond. A dressing of sterile gauze and Hypafix was  applied.  Following surgery the patient was turned back to the supine position to be reversed and the anesthetic extubated and transferred to the recovery room for further care.   PLAN OF CARE: Admit to inpatient   PATIENT DISPOSITION:  PACU - hemodynamically stable.   Delay start of Pharmacological VTE agent (>24hrs) due to surgical blood loss or risk of bleeding:  yes

## 2017-01-02 NOTE — Transfer of Care (Signed)
Immediate Anesthesia Transfer of Care Note  Patient: David Carpenter  Procedure(s) Performed: LUMBAR TWO- LUMBAR THREE DECOMPRESSION,POSTERIOR LUMBAR INTERBODY FUSION, AND POSTERIOR LATERAL ARTHRODESIS (N/A Back)  Patient Location: PACU  Anesthesia Type:General  Level of Consciousness: awake, alert  and oriented  Airway & Oxygen Therapy: Patient connected to face mask oxygen  Post-op Assessment: Post -op Vital signs reviewed and stable  Post vital signs: stable  Last Vitals:  Vitals:   01/02/17 0632  BP: (!) 140/96  Pulse: 76  Resp: 19  Temp: 36.4 C  SpO2: 96%    Last Pain:  Vitals:   01/02/17 0649  TempSrc:   PainSc: 4       Patients Stated Pain Goal: 2 (01/02/17 0649)  Complications: No apparent anesthesia complications

## 2017-01-03 MED ORDER — HYDROCODONE-ACETAMINOPHEN 5-325 MG PO TABS: 1.0000 | ORAL_TABLET | ORAL | 0 refills | Status: AC | PRN

## 2017-01-03 MED FILL — Thrombin For Soln Kit 20000 Unit: CUTANEOUS | Qty: 1 | Status: AC

## 2017-01-03 MED FILL — Sodium Chloride IV Soln 0.9%: INTRAVENOUS | Qty: 2000 | Status: AC

## 2017-01-03 MED FILL — Heparin Sodium (Porcine) Inj 1000 Unit/ML: INTRAMUSCULAR | Qty: 30 | Status: AC

## 2017-01-03 NOTE — Discharge Summary (Signed)
Physician Discharge Summary  Patient ID: David Carpenter MRN: [id number] DOB/AGE: 74-09-1940 74 y.o.  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 Postoperatively stent well. He is up and ambulate actively. He is voiding well. His incision is healing nicely. There is no erythema, swelling, or drainage. He is asking to be discharged to home. He has been given instructions regarding wound care and activities following discharge. He is to follow-up with me in 3 weeks in the office.  Discharge Exam: Blood pressure 118/76, pulse 66, temperature 98.2 F (36.8 C), temperature source Oral, resp. rate 16, height 5\' 10"  (1.778 m), weight 90.7 kg (200 lb), SpO2 94 %.  Disposition:  Home  Discharge Instructions    Discharge wound care:    Complete by:  As directed    Leave the wound open to air. Shower daily with the wound uncovered. Water and soapy water should run over the incision area. Do not wash directly on the incision for 2 weeks. Remove the glue after 2 weeks.   Driving Restrictions    Complete by:  As directed    No driving for 2 weeks. May ride in the car locally now. May begin to drive locally in 2 weeks.   Other Restrictions    Complete by:  As directed    Walk gradually increasing distances out in the fresh air at least twice a day. Walking additional 6 times inside the house, gradually increasing distances, daily. No bending, lifting, or twisting.  Perform activities between shoulder and waist height (that is at counter height when standing or table height when sitting).     Allergies as of 01/03/2017      Reactions   Shellfish Allergy Nausea And Vomiting   Shrimp      Medication List    TAKE these medications   benazepril 10 MG tablet Commonly known as:  LOTENSIN benazepril 5 mg tablet  Take 1 tablet every day by oral route.   D3-1000 1000 units capsule Generic drug:  Cholecalciferol Take 1,000 Units by mouth daily.   HYDROcodone-acetaminophen 5-325 MG tablet Commonly known as:  NORCO/VICODIN Take 1-2 tablets by mouth every 4 (four) hours as needed (pain).   pravastatin 20 MG tablet Commonly known as:  PRAVACHOL Take 10 mg by mouth daily.   tamsulosin 0.4 MG Caps capsule Commonly known as:  FLOMAX Take 0.4 mg by mouth daily.   triamterene-hydrochlorothiazide 37.5-25 MG capsule Commonly known as:  DYAZIDE Take 1 capsule by mouth daily.            Discharge Care Instructions        Start     Ordered   01/03/17 0000  Discharge wound care:    Comments:  Leave the wound open to air. Shower daily with the wound uncovered. Water and soapy water should run over the incision area. Do not wash directly on the incision for 2 weeks. Remove the glue after 2 weeks.   01/03/17 0758       Signed: Hewitt ShortsNUDELMAN,ROBERT W 01/03/2017, 7:58 AM

## 2017-01-03 NOTE — Discharge Instructions (Signed)

## 2018-08-19 IMAGING — CR DG LUMBAR SPINE 2-3V
2 series · 2 of 2 positions shown · non-contrast
Comparison: Lumbar spine MRI 10/25/2016

CLINICAL DATA: L2-3 fusion.

EXAM:
LUMBAR SPINE - 2-3 VIEW; DG C-ARM 61-120 MIN

[xtable lateral (1 of 2)]
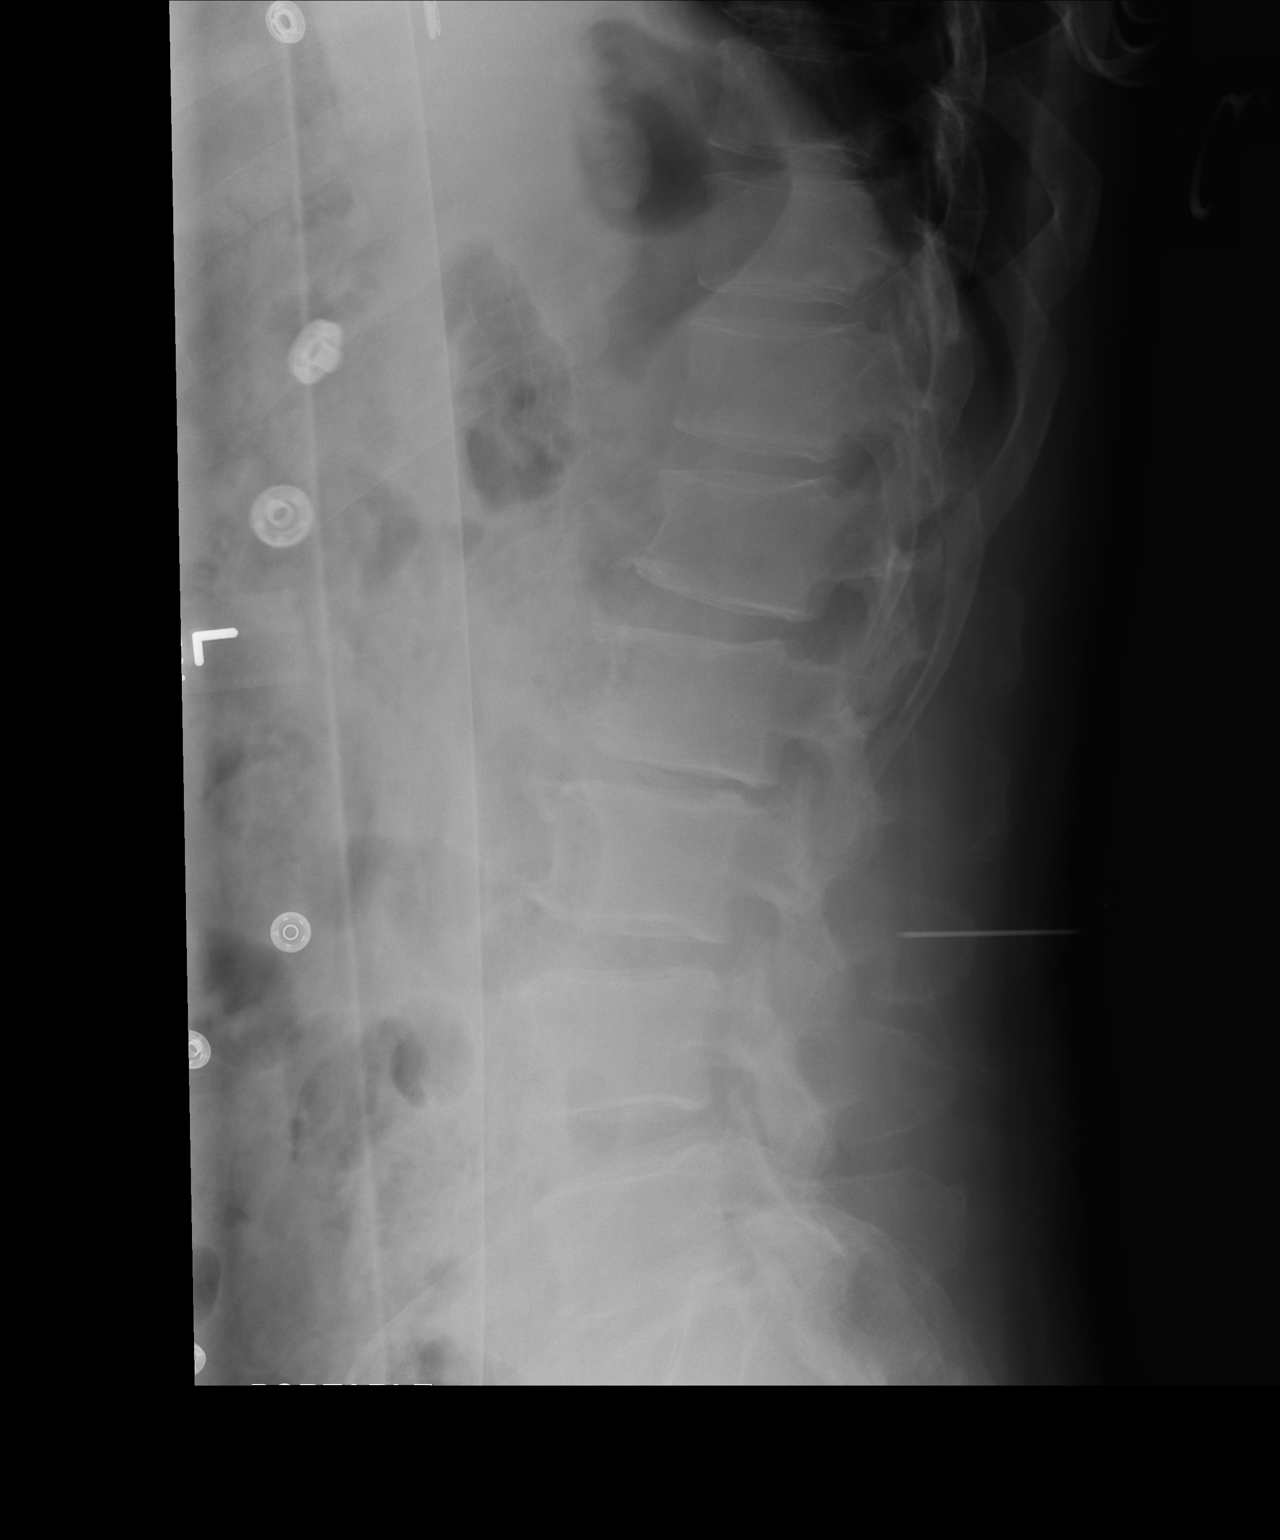

[xtable lateral (2 of 2)]
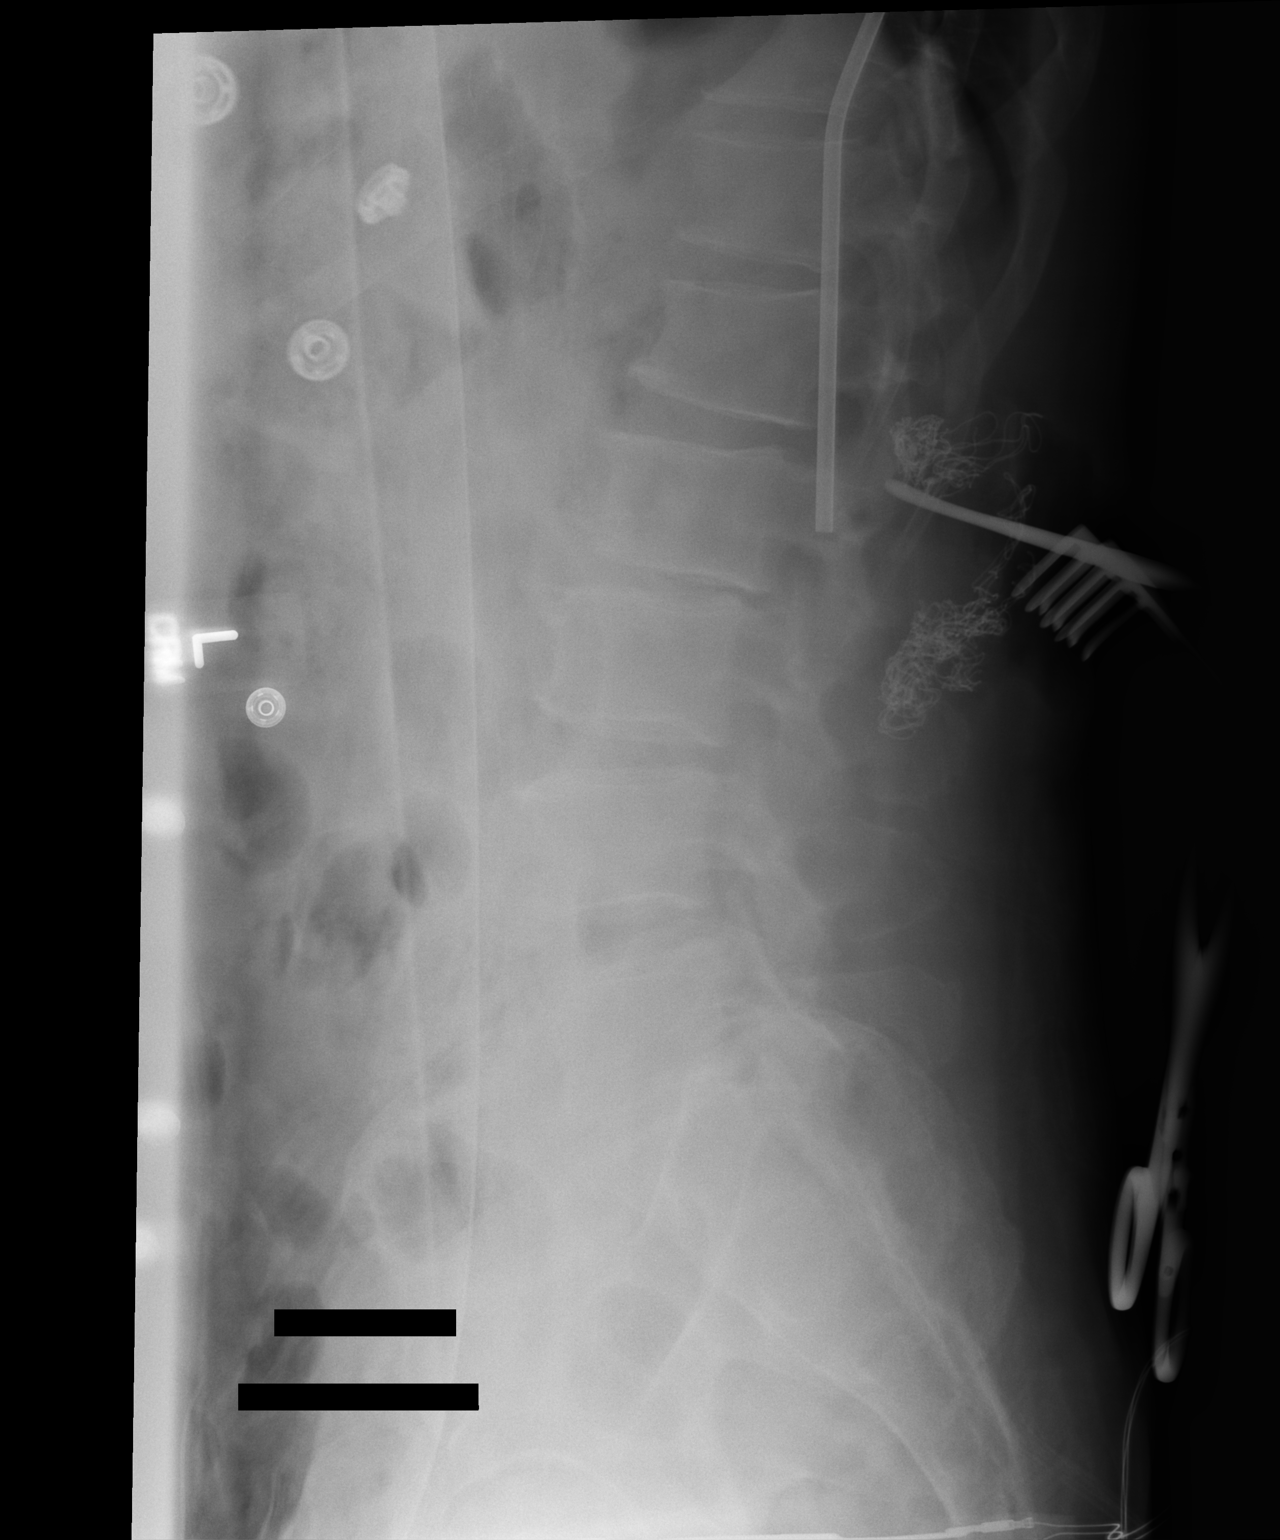

[2 of 2 positions shown; findings below may reference images not displayed]

FINDINGS: The first intraoperative localization lateral lumbar spine film
Demonstrates a spinal needle projecting over the L3 spinous process.

The second film demonstrates a surgical instrument marking the L1-2
disc space level.

Intraoperative AP and lateral spot films of the lumbar spine
demonstrate well-positioned pedicle screws, posterior rods and
interbody fusion device at L2-3.
IMPRESSION: Well-positioned fusion hardware at L2-3 without complicating
features.

## 2022-07-27 ENCOUNTER — Other Ambulatory Visit: Payer: Self-pay

## 2022-07-27 ENCOUNTER — Ambulatory Visit: Payer: No Typology Code available for payment source | Admitting: Allergy & Immunology

## 2022-07-27 ENCOUNTER — Encounter: Payer: Self-pay | Admitting: Allergy & Immunology

## 2022-07-27 VITALS — BP 128/86 | HR 84 | Temp 98.1°F | Resp 18 | Ht 68.0 in | Wt 199.6 lb

## 2022-07-27 DIAGNOSIS — R221 Localized swelling, mass and lump, neck: Secondary | ICD-10-CM | POA: Diagnosis not present

## 2022-07-27 DIAGNOSIS — H1013 Acute atopic conjunctivitis, bilateral: Secondary | ICD-10-CM

## 2022-07-27 DIAGNOSIS — R220 Localized swelling, mass and lump, head: Secondary | ICD-10-CM

## 2022-07-27 DIAGNOSIS — H1010 Acute atopic conjunctivitis, unspecified eye: Secondary | ICD-10-CM

## 2022-07-27 NOTE — Progress Notes (Signed)
NEW PATIENT  Date of Service/Encounter:  07/27/22  Consult requested by: Patient, No Pcp Per   Assessment:   No diagnosis found.  Plan/Recommendations:    There are no Patient Instructions on file for this visit.   {Blank single:19197::"This note in its entirety was forwarded to the Provider who requested this consultation."}  Subjective:   David Carpenter is a 82 y.o. male presenting today for evaluation of  Chief Complaint  Patient presents with   Angioedema    Face swelling - 4-6 hours after eating not sure if it is food related    Allergic Reaction    Known shellfish allergy     David Carpenter has a history of the following: Patient Active Problem List   Diagnosis Date Noted   Lumbar stenosis with neurogenic claudication 01/02/2017    History obtained from: chart review and patient.  David Carpenter was referred by Patient, No Pcp Per.     David Carpenter is a 82 y.o. male presenting for an evaluation of facial swelling .  He has been having symptoms of facial swelling for 6-7 years. Around that far ago, he had some swelling around his right side of the mouth. He went to see PCP and said this was a dental issue. Then then dentist says that it is not a dental problem. It is mostly around the lip, typically on the top of the lip. Sometimes this includes his eyes. Benadryl helps (he only takes one) and it will take a full 3-4 days to resolve. There is no itching on the face. He denies any throat swelling at all. Around two months ago, he had some itching on his chin and it had a "rough feeling" like sandpaper. There were no changes at all. He has lived in the same area for 20-30 minutes. He has swollen at his girlfriends house and his house. He has been to the ED multiple times and his PCP several times. He is given steroid shots as well with improvement in the symptoms.   Sometimes he will have itching on the palms and wrist.  It can be weekly and then he can go 3-4 months  without occurrences. He was on benzapril but the severity of his symptoms has not changed. This did not really improve the smyptoms, but rather made them more frequent. It is now happening every two months or so.    {Blank single:19197::"Asthma/Respiratory Symptom History: ***"," "}  Allergic Rhinitis Symptom History: He does have some sneezing. He does have some sinus issues, but this is few and far between. He had never been tested.  {Blank single:19197::"Food Allergy Symptom History: ***"," "}  {Blank single:19197::"Skin Symptom History: ***"," "}  {Blank single:19197::"GERD Symptom History: ***"," "}  ***Otherwise, there is no history of other atopic diseases, including {Blank multiple:19196:o:"asthma","food allergies","drug allergies","environmental allergies","stinging insect allergies","eczema","urticaria","contact dermatitis"}. There is no significant infectious history. ***Vaccinations are up to date.    Past Medical History: Patient Active Problem List   Diagnosis Date Noted   Lumbar stenosis with neurogenic claudication 01/02/2017    Medication List:  Allergies as of 07/27/2022       Reactions   Other Nausea And Vomiting   Shrimp   Shellfish Allergy Nausea And Vomiting   Shrimp        Medication List        Accurate as of Jul 27, 2022  1:55 PM. If you have any questions, ask your nurse or doctor.  benazepril 10 MG tablet Commonly known as: LOTENSIN benazepril 5 mg tablet  Take 1 tablet every day by oral route.   D3-1000 25 MCG (1000 UT) capsule Generic drug: Cholecalciferol Take 1,000 Units by mouth daily.   HYDROcodone-acetaminophen 5-325 MG tablet Commonly known as: NORCO/VICODIN Take 1-2 tablets by mouth every 4 (four) hours as needed (pain).   pravastatin 20 MG tablet Commonly known as: PRAVACHOL Take 10 mg by mouth daily.   tamsulosin 0.4 MG Caps capsule Commonly known as: FLOMAX Take 0.4 mg by mouth daily.    triamterene-hydrochlorothiazide 37.5-25 MG capsule Commonly known as: DYAZIDE Take 1 capsule by mouth daily.        Birth History: {Blank single:19197::"non-contributory","born premature and spent time in the NICU","born at term without complications"}  Developmental History: David Carpenter has met all milestones on time. He has required no {Blank multiple:19196:a:"speech therapy","occupational therapy","physical therapy"}. ***non-contributory  Past Surgical History: Past Surgical History:  Procedure Laterality Date   COLONOSCOPY     fatty tissue   1998   removed from from back    HEMORROIDECTOMY       Family History: Family History  Problem Relation Age of Onset   Dementia Mother    Heart attack Father    Melanoma Sister    Melanoma Brother      Social History: David Carpenter lives at home with ***.    ROS     Objective:   Blood pressure 128/86, pulse 84, temperature 98.1 F (36.7 C), resp. rate 18, height 5\' 8"  (1.727 m), weight 199 lb 9.6 oz (90.5 kg), SpO2 96 %. Body mass index is 30.35 kg/m.     Physical Exam   Diagnostic studies: {Blank single:19197::"none","deferred due to recent antihistamine use","deferred due to insurance stipulations that refuse to pay for testing at initial visits, making it more difficult for patients to get the care they need","labs sent instead"," "}  Spirometry: {Blank single:19197::"results normal (FEV1: ***%, FVC: ***%, FEV1/FVC: ***%)","results abnormal (FEV1: ***%, FVC: ***%, FEV1/FVC: ***%)"}.    {Blank single:19197::"Spirometry consistent with mild obstructive disease","Spirometry consistent with moderate obstructive disease","Spirometry consistent with severe obstructive disease","Spirometry consistent with possible restrictive disease","Spirometry consistent with mixed obstructive and restrictive disease","Spirometry uninterpretable due to technique","Spirometry consistent with normal pattern"}. {Blank single:19197::"Albuterol/Atrovent  nebulizer","Xopenex/Atrovent nebulizer","Albuterol nebulizer","Albuterol four puffs via MDI","Xopenex four puffs via MDI"} treatment given in clinic with {Blank single:19197::"significant improvement in FEV1 per ATS criteria","significant improvement in FVC per ATS criteria","significant improvement in FEV1 and FVC per ATS criteria","improvement in FEV1, but not significant per ATS criteria","improvement in FVC, but not significant per ATS criteria","improvement in FEV1 and FVC, but not significant per ATS criteria","no improvement"}.  Allergy Studies: {Blank single:19197::"none","labs sent instead"," "}    {Blank single:19197::"Allergy testing results were read and interpreted by myself, documented by clinical staff."," "}         Malachi Bonds, MD [facility]

## 2022-07-27 NOTE — Patient Instructions (Addendum)
1. Swelling of lip, tongue, and throat - We are going to get some labs to look for weird causes of swelling. - I do not think that any skin testing would be useful at this point. - In the future, I would use a longer acting antihistamine combination: Zyrtec 10mg  + Pepcid 20mg  twice daily for a few days - We can add on an EpiPen to have on hand if needed.   2. Seasonal allergic conjunctivitis - I did not do allergy testing since your symptoms were not too severe.  - We can always consider that in the future.   3. Return in about 3 months (around 10/27/2022). You can have the follow up appointment with Dr. Dellis Anes or a Nurse Practicioner (our Nurse Practitioners are excellent and always have Physician oversight!).    Please inform us of any Emergency Department visits, hospitalizations, or changes in symptoms. Call us before going to the ED for breathing or allergy symptoms since we might be able to fit you in for a sick visit. Feel free to contact us anytime with any questions, problems, or concerns.  It was a pleasure to meet you and Crist Fat today!  Websites that have reliable patient information: 1. American Academy of Asthma, Allergy, and Immunology: www.aaaai.org 2. Food Allergy Research and Education (FARE): foodallergy.org 3. Mothers of Asthmatics: http://www.asthmacommunitynetwork.org 4. American College of Allergy, Asthma, and Immunology: www.acaai.org   COVID-19 Vaccine Information can be found at: [email or web address].[email or web address] For questions related to vaccine distribution or appointments, please email [email or web address] or call [phone].   We realize that you might be concerned about having an allergic reaction to the COVID19 vaccines. To help with that concern, WE ARE OFFERING THE COVID19 VACCINES IN OUR OFFICE! Ask the front desk for dates!     "Like" [place] on Facebook and Instagram for our latest updates!      A healthy  democracy works best when [employer] participate! Make sure you are registered to vote! If you have moved or changed any of your contact information, you will need to get this updated before voting!  In some cases, you MAY be able to register to vote online: [email or web address]

## 2022-07-28 ENCOUNTER — Encounter: Payer: Self-pay | Admitting: Allergy & Immunology

## 2022-07-28 LAB — ALLERGY PANEL 19, SEAFOOD GROUP

## 2022-07-28 LAB — COMPLEMENT COMPONENT C1Q

## 2022-07-28 LAB — C1 ESTERASE INHIBITOR

## 2022-07-29 LAB — TRYPTASE: Tryptase: 7.2 ug/L (ref 2.2–13.2)

## 2022-07-29 LAB — ALLERGY PANEL 19, SEAFOOD GROUP

## 2022-07-29 LAB — ALLERGEN HYMENOPTERA PANEL

## 2022-07-30 LAB — C1 ESTERASE INHIBITOR, FUNCTIONAL: C1INH Functional/C1INH Total MFr SerPl: >110 %mean normal

## 2022-07-30 LAB — ALLERGEN HYMENOPTERA PANEL

## 2022-07-30 LAB — ALLERGY PANEL 19, SEAFOOD GROUP: Tuna: <0.1 kU/L

## 2022-07-30 LAB — C3 AND C4: Complement C3, Serum: 167 mg/dL (ref 82–167)

## 2022-08-01 LAB — ALLERGEN HYMENOPTERA PANEL
Bumblebee: <0.1 kU/L
Hornet, White Face, IgE: <0.1 kU/L

## 2022-08-01 LAB — ALLERGY PANEL 19, SEAFOOD GROUP
Allergen Salmon IgE: <0.1 kU/L
Codfish IgE: <0.1 kU/L

## 2022-08-01 LAB — C3 AND C4: Complement C4, Serum: 29 mg/dL (ref 12–38)

## 2022-08-04 LAB — ALLERGEN HYMENOPTERA PANEL
Paper Wasp IgE: <0.1 kU/L
Yellow Jacket, IgE: <0.1 kU/L

## 2022-08-04 LAB — ALLERGY PANEL 19, SEAFOOD GROUP

## 2022-11-04 ENCOUNTER — Ambulatory Visit: Payer: Medicare Other | Admitting: Allergy & Immunology

## 2023-01-26 ENCOUNTER — Telehealth: Payer: Self-pay | Admitting: Allergy & Immunology

## 2023-01-26 NOTE — Telephone Encounter (Signed)
I called and left a voicemail for the patient to give the office a call back to get scheduled for a follow up visit.
